# Patient Record
Sex: Male | Born: 1990 | Hispanic: No | Marital: Single | Smoking: Never smoker
Health system: Southern US, Community
[De-identification: ages and names within clinical notes are randomized; demographics above are authoritative.]

## PROBLEM LIST (undated history)

## (undated) HISTORY — PX: OTHER SURGICAL HISTORY: SHX169

## (undated) HISTORY — PX: WISDOM TOOTH EXTRACTION: SHX21

---
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
## 2011-02-17 ENCOUNTER — Ambulatory Visit: Payer: Self-pay | Admitting: Family Medicine

## 2011-02-18 ENCOUNTER — Ambulatory Visit (INDEPENDENT_AMBULATORY_CARE_PROVIDER_SITE_OTHER): Payer: Medicaid Other | Admitting: Family Medicine

## 2011-02-18 DIAGNOSIS — H6120 Impacted cerumen, unspecified ear: Secondary | ICD-10-CM

## 2011-02-18 DIAGNOSIS — I10 Essential (primary) hypertension: Secondary | ICD-10-CM

## 2011-02-18 NOTE — Patient Instructions (Signed)
Make a blood pressure check visit in 2 weeks with the nurse. Check it yourself at a pharmacy. The goal is </= 120/80. If you continue to have ear popping and feeling of stopped up.  Try OTC cetirizine (Zyrtec) 10 mg once daily. For 1 month.  Good luck with your summer plans and schooling!

## 2011-02-19 DIAGNOSIS — I10 Essential (primary) hypertension: Secondary | ICD-10-CM | POA: Insufficient documentation

## 2011-02-19 DIAGNOSIS — H6120 Impacted cerumen, unspecified ear: Secondary | ICD-10-CM | POA: Insufficient documentation

## 2011-02-19 NOTE — Assessment & Plan Note (Signed)
Ear canals both almost completely occluded by ear wax. It was removed today and pt was told that he can try Zyrtec for 1 month if he still notices the popping noise in his ears. If the Zyrtec helps we can write an Rx for it long term.

## 2011-02-19 NOTE — Progress Notes (Signed)
Ears popping: Pt is here today because he has had some popping in his ears for the last few days. He notices it more in the shower. He says they do not hurt and he is not really having any allergy symptoms. No recent URI. He does not use q-tips in his ears.   ROS: neg except as noted in HPI.   PE:  Gen: sitting comfortably on the table.  HEENT: Twining/AT, eyes are normal, ears have bilateral cerumen impactions, (no bulging of TM's after ears cleaned out). No nasal congestion, no erythema in posterior pharynx.  CV: rrr, no murmur Pulm: CTAB, no wheezing.

## 2011-02-19 NOTE — Assessment & Plan Note (Signed)
Pt has some elevated BP today. He denies problems with this in the past. Asked the pt to return in 2 weeks for a BP check and to check on his own at a pharmacy or [facility].

## 2011-10-01 ENCOUNTER — Ambulatory Visit: Payer: Medicaid Other | Admitting: Family Medicine

## 2011-10-07 ENCOUNTER — Encounter: Payer: Self-pay | Admitting: Family Medicine

## 2011-10-07 ENCOUNTER — Ambulatory Visit (INDEPENDENT_AMBULATORY_CARE_PROVIDER_SITE_OTHER): Payer: Medicare HMO | Admitting: Family Medicine

## 2011-10-07 VITALS — BP 118/75 | HR 56 | Temp 98.3°F | Ht 69.0 in | Wt 165.0 lb

## 2011-10-07 DIAGNOSIS — Z0000 Encounter for general adult medical examination without abnormal findings: Secondary | ICD-10-CM

## 2011-10-07 NOTE — Assessment & Plan Note (Signed)
Normal growth and vitals with no medical or social risk factors identified.  Advised on Gardasil and flu vaccination.  Ok to play basketball without restrictions.

## 2011-10-07 NOTE — Patient Instructions (Signed)
Today your growth, weight and vitals are healthy.  You are ok to play sports without any restrictions  I would recommend and annual flu shot and completing the Gardasil series of vaccinations.

## 2011-10-07 NOTE — Progress Notes (Signed)
  Subjective:    Patient ID: Oscar Chen, male    DOB: March 29, 1991, 21 y.o.   MRN: [id number]  HPIHere for annual preventive exam.  H (Home):  Here home on school break E (Education):  [employer]: film studies A (Activities):  Sports/hobbies; basketball, piano A (Auto/Safety):  Seatbelts D (Diet):  Balanced diet, healthy weight D (Drugs):  No tobacco, EtOH, or drug use. S (Suicide risk):  Denies sexual activity.  No SI/HI.  I have reviewed patient's  PMH, FH, and Social history and Medications as related to this visit.  Review of Systemssee HPI     Objective:   Physical Exam GEN: Alert & Oriented, No acute distress Neck: supple CV:  Regular Rate & Rhythm, no murmur Respiratory:  Normal work of breathing, CTAB Abd:  + BS, soft, no tenderness to palpation Ext: no pre-tibial edema MSK: full rom and strength of shoulders, back, knees without pain. GEN: deferred          Assessment & Plan:

## 2012-05-05 ENCOUNTER — Encounter: Payer: Self-pay | Admitting: Family Medicine

## 2012-05-05 ENCOUNTER — Ambulatory Visit (INDEPENDENT_AMBULATORY_CARE_PROVIDER_SITE_OTHER): Payer: Medicare HMO | Admitting: Family Medicine

## 2012-05-05 VITALS — BP 125/73 | HR 86 | Temp 99.1°F | Wt 171.1 lb

## 2012-05-05 DIAGNOSIS — J069 Acute upper respiratory infection, unspecified: Secondary | ICD-10-CM

## 2012-05-05 MED ORDER — DM-GUAIFENESIN ER 60-1200 MG PO TB12: 1.0000 | ORAL_TABLET | Freq: Two times a day (BID) | ORAL | Status: AC

## 2012-05-05 MED ORDER — FLUTICASONE PROPIONATE 50 MCG/ACT NA SUSP: 2.0000 | Freq: Every day | NASAL | Status: DC

## 2012-05-05 NOTE — Assessment & Plan Note (Signed)
Discussed diagnosis and treatment of URI. Suggested symptomatic remedies per AVS. Mucinex DM and Flonase to pharmacy. Red flags reviewed per AVS. Work note given. Follow up as needed.

## 2012-05-05 NOTE — Progress Notes (Signed)
  Subjective:     Oscar Chen is a 21 y.o. male who presents for evaluation of symptoms of a URI. Symptoms include achiness, nasal congestion, post nasal drip, sneezing, sore throat and ear pain bilaterally. Onset of symptoms was 1 day ago, and has been gradually worsening since that time.  Patient woke up with LT ear pain yesterday, now both ears are painful.  He also developed sore throat and runny nose today.  Denies any productive cough at this time.  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  Treatment to date: none.  Review of Systems Pertinent items are noted in HPI.   Objective:    BP 125/73  Pulse 86  Temp 99.1 F (37.3 C) (Oral)  Wt 171 lb 1.6 oz (77.61 kg) General appearance: alert, cooperative and no distress Head: Normocephalic, without obvious abnormality, atraumatic Eyes: conjunctivae/corneas clear. PERRL, EOM's intact. Fundi benign. Ears: normal TM's and external ear canals both ears Nose: Nares normal. Septum midline. Mucosa normal. No drainage or sinus tenderness. Throat: lips, mucosa, and tongue normal; teeth and gums normal Lungs: clear to auscultation bilaterally Heart: regular rate and rhythm, S1, S2 normal, no murmur, click, rub or gallop Skin: Skin color, texture, turgor normal. No rashes or lesions   Assessment:    viral upper respiratory illness   Plan:   See Problem List.

## 2012-05-05 NOTE — Patient Instructions (Addendum)
Please pick up two prescriptions at your pharmacy. Get plenty of rest and drink a lot of fluids. You can buy Cepacol cough drops to numb your sore throat. Also you can drink Chamomile tea with honey. Return to clinic or go to ED if you develop persistent high fevers chill nausea/vomiting.  Upper Respiratory Infection, Adult An upper respiratory infection (URI) is also known as the common cold. It is often caused by a type of germ (virus). Colds are easily spread (contagious). You can pass it to others by kissing, coughing, sneezing, or drinking out of the same glass. Usually, you get better in 1 or 2 weeks.  HOME CARE   Only take medicine as told by your doctor.   Use a warm mist humidifier or breathe in steam from a hot shower.   Drink enough water and fluids to keep your pee (urine) clear or pale yellow.   Get plenty of rest.   Return to work when your temperature is back to normal or as told by your doctor. You may use a face mask and wash your hands to stop your cold from spreading.  GET HELP RIGHT AWAY IF:   After the first few days, you feel you are getting worse.   You have questions about your medicine.   You have chills, shortness of breath, or brown or red spit (mucus).   You have yellow or brown snot (nasal discharge) or pain in the face, especially when you bend forward.   You have a fever, puffy (swollen) neck, pain when you swallow, or white spots in the back of your throat.   You have a bad headache, ear pain, sinus pain, or chest pain.   You have a high-pitched whistling sound when you breathe in and out (wheezing).   You have a lasting cough or cough up blood.   You have sore muscles or a stiff neck.  MAKE SURE YOU:   Understand these instructions.   Will watch your condition.   Will get help right away if you are not doing well or get worse.  Document Released: 03/09/2008 Document Revised: 09/10/2011 Document Reviewed: 01/26/2011 [facility] Patient  Information 2012 [place], [place].

## 2012-05-13 ENCOUNTER — Telehealth: Payer: Self-pay | Admitting: Family Medicine

## 2012-05-13 NOTE — Telephone Encounter (Signed)
Patient is calling for Dr. Tye Savoy about the ear ache that turned into a sore throat.  For the last 3 days, he has had nose bleeds and he would like some advice.

## 2013-03-21 ENCOUNTER — Ambulatory Visit: Payer: Medicare HMO | Admitting: Family Medicine

## 2013-03-22 ENCOUNTER — Ambulatory Visit (INDEPENDENT_AMBULATORY_CARE_PROVIDER_SITE_OTHER): Payer: No Typology Code available for payment source | Admitting: Family Medicine

## 2013-03-22 ENCOUNTER — Encounter: Payer: Self-pay | Admitting: Family Medicine

## 2013-03-22 VITALS — BP 125/60 | HR 49 | Temp 97.8°F | Ht 69.0 in | Wt 167.9 lb

## 2013-03-22 DIAGNOSIS — R040 Epistaxis: Secondary | ICD-10-CM | POA: Insufficient documentation

## 2013-03-22 LAB — CBC
HCT: 42.1 % (ref 39.0–52.0)
MCHC: 33.5 g/dL (ref 30.0–36.0)
RDW: 13 % (ref 11.5–15.5)

## 2013-03-22 NOTE — Assessment & Plan Note (Signed)
Will obtain CBC to ensure adequate platelet count. Recommended keeping nasal mucosa moist with vaseline and/or nasal saline. Patient knowledgeable about what to do to control epistaxis when it occurs.

## 2013-03-22 NOTE — Progress Notes (Signed)
Subjective:     Patient ID: Oscar Chen, male   DOB: 06-26-1991, 22 y.o.   MRN: [id number]  HPI 22 year old male presents to the clinic today for evaluation of recurrent nosebleeds.  1) Recurrent epistaxis - Patient reports that he has had nosebleeds in the past (typically 5/year), but over the past 1-2 weeks he had had several nosebleeds with larger amounts of blood than previously. - He reports that one episode a few days ago took approximately 30 mins to resolve with pressure and there was a profuse amount of blood.  His nosebleeds typically resolve in a few minutes with pressure. - He denies any family history of easy bleeding or bruising. - He reports these nosebleeds occur spontaneously and denies any recent trauma or injury.  He does endorse a prior injury to his nose years ago.  No inciting factor known, but he recently relocated from [place] to [place]. - He has not used any OTC therapies for this. - No other complaints at this time.    Review of Systems Per HPI    Objective:   Physical Exam  Constitutional: He appears well-developed and well-nourished. No distress.  HENT:  Head: Normocephalic and atraumatic.  Nasal mucosa appears erythematous.  No lesions or polyps noted.  Eyes: No scleral icterus.  Cardiovascular: Regular rhythm and normal heart sounds.   No murmur heard. Bradycardia noted.  Pulmonary/Chest: Breath sounds normal. He has no wheezes. He has no rales.  Abdominal: Soft. He exhibits no distension and no mass. There is no tenderness.      Assessment:    See problem list    Plan:

## 2013-03-22 NOTE — Patient Instructions (Addendum)
You will receive a letter or phone call with the results of your lab tests. Please keep your nose moist via vaseline and/or daily nasal saline.  If they continue to occur frequently and briskly, please return.   Nosebleed Nosebleeds can be caused by many conditions including trauma, infections, polyps, foreign bodies, dry mucous membranes or climate, medications and air conditioning. Most nosebleeds occur in the front of the nose. It is because of this location that most nosebleeds can be controlled by pinching the nostrils gently and continuously. Do this for at least 10 to 20 minutes. The reason for this long continuous pressure is that you must hold it long enough for the blood to clot. If during that 10 to 20 minute time period, pressure is released, the process may have to be started again. The nosebleed may stop by itself, quit with pressure, need concentrated heating (cautery) or stop with pressure from packing. HOME CARE INSTRUCTIONS   If your nose was packed, try to maintain the pack inside until your caregiver removes it. If a gauze pack was used and it starts to fall out, gently replace or cut the end off. Do not cut if a balloon catheter was used to pack the nose. Otherwise, do not remove unless instructed.  Avoid blowing your nose for 12 hours after treatment. This could dislodge the pack or clot and start bleeding again.  If the bleeding starts again, sit up and bending forward, gently pinch the front half of your nose continuously for 20 minutes.  If bleeding was caused by dry mucous membranes, cover the inside of your nose every morning with a petroleum or antibiotic ointment. Use your little fingertip as an applicator. Do this as needed during dry weather. This will keep the mucous membranes moist and allow them to heal.  Maintain humidity in your home by using less air conditioning or using a humidifier.  Do not use aspirin or medications which make bleeding more likely. Your  caregiver can give you recommendations on this.  Resume normal activities as able but try to avoid straining, lifting or bending at the waist for several days.  If the nosebleeds become recurrent and the cause is unknown, your caregiver may suggest laboratory tests. SEEK IMMEDIATE MEDICAL CARE IF:   Bleeding recurs and cannot be controlled.  There is unusual bleeding from or bruising on other parts of the body.  You have a fever.  Nosebleeds continue.  There is any worsening of the condition which originally brought you in.  You become lightheaded, feel faint, become sweaty or vomit blood. MAKE SURE YOU:   Understand these instructions.  Will watch your condition.  Will get help right away if you are not doing well or get worse. Document Released: 07/01/2005 Document Revised: 12/14/2011 Document Reviewed: 08/23/2009 [facility] Patient Information 2014 [place], [place].

## 2013-03-23 ENCOUNTER — Encounter: Payer: Self-pay | Admitting: Family Medicine

## 2013-07-17 ENCOUNTER — Encounter: Payer: Self-pay | Admitting: Sports Medicine

## 2013-07-17 ENCOUNTER — Ambulatory Visit (INDEPENDENT_AMBULATORY_CARE_PROVIDER_SITE_OTHER): Payer: No Typology Code available for payment source | Admitting: Sports Medicine

## 2013-07-17 VITALS — BP 121/74 | HR 52 | Ht 68.0 in | Wt 169.0 lb

## 2013-07-17 DIAGNOSIS — Z0000 Encounter for general adult medical examination without abnormal findings: Secondary | ICD-10-CM

## 2013-07-17 DIAGNOSIS — Z23 Encounter for immunization: Secondary | ICD-10-CM

## 2013-07-17 LAB — COMPREHENSIVE METABOLIC PANEL
ALT: 14 U/L (ref 0–53)
Albumin: 4.2 g/dL (ref 3.5–5.2)
CO2: 28 mEq/L (ref 19–32)
Calcium: 9.7 mg/dL (ref 8.4–10.5)
Chloride: 103 mEq/L (ref 96–112)
Creat: 0.86 mg/dL (ref 0.50–1.35)
Potassium: 3.8 mEq/L (ref 3.5–5.3)

## 2013-07-17 LAB — LIPID PANEL
Cholesterol: 132 mg/dL (ref 0–200)
LDL Cholesterol: 64 mg/dL (ref 0–99)
Triglycerides: 52 mg/dL (ref <150)

## 2013-07-17 NOTE — Patient Instructions (Addendum)
   Flu shot today  We are checking labs, I'll send you your results on MyCHART  Your last tetanus shot was on 03/11/2009, you are due in 2020 unless you have a significant cut/step on a nail  Nutrition and activity per handout    If you need anything prior to your next visit please call the clinic. Please Bring all medications or accurate medication list with you to each appointment; an accurate medication list is essential in providing you the best care possible.     Here are some basic Activity rules to remember: Look for exercise opportunities in your day:  Never lie down when you can sit; never sit when you can stand; never stand when you can pace.  Moving your body throughout the day is just as important as the 30 or 60 minutes of exercise at the gym!   Here are some basic nutrition rules to remember:  "Eat Real Foods & Drink Real Drinks" - if you think it was made in a factory . . it is likely best to avoid it as a staple in your diet.  Limiting these types of foods to 1-2 times per week is a good idea.  Sticking with fresh fruits and vegetables as well as home cooked meals will typically provide more nutrition and less salt than prepackaged meals.     Limit the amount of sugar sweetened and artificially sweetened foods and beverages.  Sticking with water flavored with a slice of lemon, lime or orange is a great option if you want something with flavor in it.  Using flavored seltzer water to flavor plain water will also add some bite if you want something more than flavor.  Avoid soda, juices and generally any bottled beverage is a good idea.     Eat at least 3 meals and 1-2 snacks per day.  Aim for no more than 5 hours between eating.   Here are 2 of my favorite web sites that provide great nutrition and exercise advice.   www.eats[place]movemore[place].com www.choosemyplate.gov

## 2013-07-17 NOTE — Progress Notes (Signed)
[facility] Oscar Chen - 22 y.o. male MRN [id number]  Date of birth: 1991/08/22  CC, HPI, INTERVAL HISTORY & ROS  Oscar Chen is here today for CPE    He reports overall been very healthy.  He denies being sexually active at this time.  Pt denies chest pain, dyspnea at rest or exertion, PND, lower extremity edema.  No urinary complaints.  No bowel complaints.  Normal sleep  Patient is active on a regular basis.  He currently works at [employer].  He has a varied diet.  He does not smoke, he does not drink.  He does not use illicit substances.  History  Past Medical, Surgical, Social, and Family History Reviewed per EMR Medications and Allergies reviewed and all updated if necessary. Objective Findings  VITALS: HR: 52 bpm  BP: 121/74 mmHg  TEMP:   ( )  RESP:    HT: 5\' 8"  (172.7 cm)  WT: 169 lb (76.658 kg)  BMI: 25.8   BP Readings from Last 3 Encounters:  07/17/13 121/74  03/22/13 125/60  05/05/12 125/73   Wt Readings from Last 3 Encounters:  07/17/13 169 lb (76.658 kg)  03/22/13 167 lb 14.4 oz (76.159 kg)  05/05/12 171 lb 1.6 oz (77.61 kg) (72%*, Z = 0.58)   * Growth percentiles are based on CDC 2-20 Years data.     PHYSICAL EXAM: GENERAL:  young adult male. In no discomfort; no respiratory distress  PSYCH: alert and appropriate, good insight   HNEENT: H&N: AT/Vega, trachea midline  Eyes: no scleral icterus, no conjunctival exudate  Ears:  tympanic membranes are pearly gray   Nose:  normal nasal mucosa   Oropharynx: MMM, no tonsillar erythema   Dentention:     CARDIO: RRR, S1/S2 heard, no murmur  LUNGS: CTA B, no wheezes, no crackles  ABDOMEN: +BS, soft, non-tender, no rigidity, no guarding, no masses/hepatosplenomegaly  EXTREM:  Warm, well perfused.  Moves all 4 extremities spontaneously; no lateralization.  No noted foot lesions.  Distal pulses 2+/4.  No pretibial edema.  GU:   SKIN:     Assessment & Plan   Problems addressed today:  General Plan & Pt Instructions:  1. Annual physical exam       Flu shot today  We are checking labs, I'll send you your results on MyCHART  Your last tetanus shot was on 03/11/2009, you are due in 2020 unless you have a significant cut/step on a nail  Nutrition and activity per handout      For further discussion of A/P and for follow up issues see problem based charting if applicable.

## 2013-07-17 NOTE — Assessment & Plan Note (Signed)
Flu shot today. Up-to-date on tetanus. Screening lipids and metabolic panel. Discuss Guardasil, patient deferred Spent time discussing healthy lifestyle

## 2013-08-03 ENCOUNTER — Encounter: Payer: Self-pay | Admitting: Sports Medicine

## 2013-08-03 ENCOUNTER — Telehealth: Payer: Self-pay | Admitting: Sports Medicine

## 2013-08-03 NOTE — Telephone Encounter (Signed)
Patient called and is wanting to know the results of his labs from a couple weeks ago.

## 2013-08-04 ENCOUNTER — Telehealth: Payer: Self-pay | Admitting: Sports Medicine

## 2013-08-04 NOTE — Telephone Encounter (Signed)
LVM for patient to call back. ?

## 2013-08-04 NOTE — Telephone Encounter (Signed)
All were normal. Letter has been sent. Please call and inform

## 2013-08-04 NOTE — Telephone Encounter (Signed)
LM informing labs are normal

## 2013-08-04 NOTE — Telephone Encounter (Signed)
Patient left message on medical records line again wanting to speak to someone concerning his recent labs.

## 2013-08-10 ENCOUNTER — Telehealth: Payer: Self-pay | Admitting: Sports Medicine

## 2013-08-10 NOTE — Telephone Encounter (Signed)
Pt called and would like Dr. Berline Chough to call him to discuss his recent lab work. He received the letter with the results but doesn't understand them. JW

## 2013-08-10 NOTE — Telephone Encounter (Signed)
Please advise. Oscar Chen  

## 2013-08-11 NOTE — Telephone Encounter (Signed)
LVM regarding that all of his labs are in an appropriate range (documented as inappropriate in the letter).   If he has any further questions he is to call and let us know.

## 2014-05-07 ENCOUNTER — Ambulatory Visit (INDEPENDENT_AMBULATORY_CARE_PROVIDER_SITE_OTHER): Payer: No Typology Code available for payment source | Admitting: Family Medicine

## 2014-05-07 ENCOUNTER — Encounter: Payer: Self-pay | Admitting: Family Medicine

## 2014-05-07 VITALS — BP 143/70 | HR 51 | Temp 98.3°F | Ht 68.0 in | Wt 176.7 lb

## 2014-05-07 DIAGNOSIS — R42 Dizziness and giddiness: Secondary | ICD-10-CM

## 2014-05-07 NOTE — Patient Instructions (Signed)
Dear Oscar Chen, Thank you for coming in to clinic today. It was good to meet you!  Today we discussed your Dizziness episode. 1. Overall, it sounds like this was an isolated incident with several inciting factors. This is most likely a stress response from your body, and would be considered a "Vaso-vagal response" 2. I do not recommend any medications or testing at this time. 3. Most important thing is reassurance, and future awareness. If it happens again pay attention to triggers and return to see your regular doctor to discuss further. 4. For the Ear Wax, recommend over the counter Debrox drops to loosen up the wax, avoid using Q-tips.  Please schedule a follow-up appointment with Dr. Claiborne Billings in the future as needed.  If you have any other questions or concerns, please feel free to call the clinic to contact me. You may also schedule an earlier appointment if necessary.  However, if your symptoms get significantly worse, please go to the Emergency Department to seek immediate medical attention.  Saralyn Pilar, DO [facility]   Vasovagal Syncope, Adult Syncope, commonly known as fainting, is a temporary loss of consciousness. It occurs when the blood flow to the brain is reduced. Vasovagal syncope (also called neurocardiogenic syncope) is a fainting spell in which the blood flow to the brain is reduced because of a sudden drop in heart rate and blood pressure. Vasovagal syncope occurs when the brain and the cardiovascular system (blood vessels) do not adequately communicate and respond to each other. This is the most common cause of fainting. It often occurs in response to fear or some other type of emotional or physical stress. The body has a reaction in which the heart starts beating too slowly or the blood vessels expand, reducing blood pressure. This type of fainting spell is generally considered harmless. However, injuries can occur if a person takes a sudden fall  during a fainting spell.  CAUSES  Vasovagal syncope occurs when a person's blood pressure and heart rate decrease suddenly, usually in response to a trigger. Many things and situations can trigger an episode. Some of these include:   Pain.   Fear.   The sight of blood or medical procedures, such as blood being drawn from a vein.   Common activities, such as coughing, swallowing, stretching, or going to the bathroom.   Emotional stress.   Prolonged standing, especially in a warm environment.   Lack of sleep or rest.   Prolonged lack of food.   Prolonged lack of fluids.   Recent illness.  The use of certain drugs that affect blood pressure, such as cocaine, alcohol, marijuana, inhalants, and opiates.  SYMPTOMS  Before the fainting episode, you may:   Feel dizzy or light headed.   Become pale.  Sense that you are going to faint.   Feel like the room is spinning.   Have tunnel vision, only seeing directly in front of you.   Feel sick to your stomach (nauseous).   See spots or slowly lose vision.   Hear ringing in your ears.   Have a headache.   Feel warm and sweaty.   Feel a sensation of pins and needles. During the fainting spell, you will generally be unconscious for no longer than a couple minutes before waking up and returning to normal. If you get up too quickly before your body can recover, you may faint again. Some twitching or jerky movements may occur during the fainting spell.  DIAGNOSIS  Your  caregiver will ask about your symptoms, take a medical history, and perform a physical exam. Various tests may be done to rule out other causes of fainting. These may include blood tests and tests to check the heart, such as electrocardiography, echocardiography, and possibly an electrophysiology study. When other causes have been ruled out, a test may be done to check the body's response to changes in position (tilt table test). TREATMENT  Most  cases of vasovagal syncope do not require treatment. Your caregiver may recommend ways to avoid fainting triggers and may provide home strategies for preventing fainting. If you must be exposed to a possible trigger, you can drink additional fluids to help reduce your chances of having an episode of vasovagal syncope. If you have warning signs of an oncoming episode, you can respond by positioning yourself favorably (lying down). If your fainting spells continue, you may be given medicines to prevent fainting. Some medicines may help make you more resistant to repeated episodes of vasovagal syncope. Special exercises or compression stockings may be recommended. In rare cases, the surgical placement of a pacemaker is considered. HOME CARE INSTRUCTIONS   Learn to identify the warning signs of vasovagal syncope.   Sit or lie down at the first warning sign of a fainting spell. If sitting, put your head down between your legs. If you lie down, swing your legs up in the air to increase blood flow to the brain.   Avoid hot tubs and saunas.  Avoid prolonged standing.  Drink enough fluids to keep your urine clear or pale yellow. Avoid caffeine.  Increase salt in your diet as directed by your caregiver.   If you have to stand for a long time, perform movements such as:   Crossing your legs.   Flexing and stretching your leg muscles.   Squatting.   Moving your legs.   Bending over.   Only take over-the-counter or prescription medicines as directed by your caregiver. Do not suddenly stop any medicines without asking your caregiver first. SEEK MEDICAL CARE IF:   Your fainting spells continue or happen more frequently in spite of treatment.   You lose consciousness for more than a couple minutes.  You have fainting spells during or after exercising or after being startled.   You have new symptoms that occur with the fainting spells, such as:   Shortness of breath.  Chest pain.    Irregular heartbeat.   You have episodes of twitching or jerky movements that last longer than a few seconds.  You have episodes of twitching or jerky movements without obvious fainting. SEEK IMMEDIATE MEDICAL CARE IF:   You have injuries or bleeding after a fainting spell.   You have episodes of twitching or jerky movements that last longer than 5 minutes.   You have more than one spell of twitching or jerky movements before returning to consciousness after fainting. MAKE SURE YOU:   Understand these instructions.  Will watch your condition.  Will get help right away if you are not doing well or get worse. Document Released: 09/07/2012 Document Reviewed: 09/07/2012 [facility] Patient Information 2015 [employer], [place]. This information is not intended to replace advice given to you by your health care provider. Make sure you discuss any questions you have with your health care provider.

## 2014-05-07 NOTE — Progress Notes (Signed)
Subjective:     Patient ID: Oscar Chen, male   DOB: 05/01/1991, 23 y.o.   MRN: [id number]  Patient presents for a same day appointment.  HPI  Dizziness Episode: - Reported acute sudden event with hearing loss, tinnitus, and dizziness / balance difficulties, lasted for 15 minutes, mostly resolved after. - States that this episode occurred while out at [employer] on Saturday [place] (05/05/14) and earlier he had been [place]. Prior to episode, he had been standing up in the warm restaurant for about 1 hour prior to event. He was eating appetizers before, and then felt borderline nausea. During episode felt brief "out of body experience". Following episode he had an associated sharp pain in bilateral jaw, worse with attempting to swallow, lasted 15 minutes and then gradually improved - Currently with no residual or recurrent symptoms. All symptoms completely resolved s/p 1st episode - No prior similar episodes before - Denied any abdominal pain, palpitations / racing heart, chest pain, SOB, HA, weakness  I have reviewed and updated the following as appropriate: allergies and current medications  Social Hx: - Never smoker - No drugs or alcohol  Review of Systems  See above HPI     Objective:   Physical Exam  BP 143/70  Pulse 51  Temp(Src) 98.3 F (36.8 C) (Oral)  Ht 5\' 8"  (1.727 m)  Wt 176 lb 11.2 oz (80.151 kg)  BMI 26.87 kg/m2  Gen - well-appearing, NAD HEENT - NCAT, PERRL, EOMI, oropharynx clear, TM's bilateral thick ear wax, MMM Neck - supple, non-tender Heart - RRR, no murmurs heard Lungs - CTAB. Normal work of breathing. Abd - soft, NTND, no masses, +active BS Ext - non-tender, no edema, peripheral pulses intact +2 b/l Skin - warm, dry Neuro - awake, alert, oriented, grossly non-focal, intact muscle strength 5/5 b/l, intact distal sensation to light touch, gait normal, cerebellar function intact     Assessment:     See  specific A&P problem list for details.      Plan:     See specific A&P problem list for details.

## 2014-05-07 NOTE — Assessment & Plan Note (Signed)
Presumed vaso-vagal episode likely due to multifactorial stress, exertion, prolonged standing in warm environment and maybe related to limited PO intake - No prior episodes - Completely resolved symptoms. No red flag symptoms. - Normal exam  Plan: 1. Monitor for future episodes 2. No indication for lab testing or further work-up today for isolated incident 3. RTC PRN

## 2014-08-14 ENCOUNTER — Ambulatory Visit (INDEPENDENT_AMBULATORY_CARE_PROVIDER_SITE_OTHER): Payer: No Typology Code available for payment source | Admitting: Family Medicine

## 2014-08-14 ENCOUNTER — Encounter: Payer: Self-pay | Admitting: Family Medicine

## 2014-08-14 VITALS — BP 129/62 | HR 70 | Temp 98.4°F | Ht 68.0 in | Wt 178.8 lb

## 2014-08-14 DIAGNOSIS — Z111 Encounter for screening for respiratory tuberculosis: Secondary | ICD-10-CM

## 2014-08-14 DIAGNOSIS — Z0000 Encounter for general adult medical examination without abnormal findings: Secondary | ICD-10-CM

## 2014-08-14 NOTE — Assessment & Plan Note (Addendum)
Patient declines flu shot today PPD given, return in 48 hours for review and will give paperwork for health clearance at that time. PHQ of 1. Will continue to monitor, discussed depression/anxiety with patient today. Patient feels that he is recovering from feeling situational anxiety, and does not a further management at this time. 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 Vision (20-20, left -right and bilateral ) and hearing passed Follow-up as needed, or in one year

## 2014-08-14 NOTE — Patient Instructions (Signed)
Tuberculin Skin Test The PPD skin test is a method used to help with the diagnosis of a disease called tuberculosis (TB). HOW THE TEST IS DONE  The test site (usually the forearm) is cleansed. The PPD extract is then injected under the top layer of skin, causing a blister to form on the skin. The reaction will take 48 - 72 hours to develop. You must return to your health care provider within that time to have the area checked. This will determine whether you have had a significant reaction to the PPD test. A reaction is measured in millimeters of hard swelling (induration) at the site. PREPARATION FOR TEST  There is no special preparation for this test. People with a skin rash or other skin irritations on their arms may need to have the test performed at a different spot on the body. Tell your health care provider if you have ever had a positive PPD skin test. If so, you should not have a repeat PPD test. Tell your doctor if you have a medical condition or if you take certain drugs, such as steroids, that can affect your immune system. These situations may lead to inaccurate test results. NORMAL FINDINGS A negative reaction (no induration) or a level of hard swelling that falls below a certain cutoff may mean that a person has not been infected with the bacteria that cause TB. There are different cutoffs for children, people with HIV, and other risk groups. Unfortunately, this is not a perfect test, and up to 20% of people infected with tuberculosis may not have a reaction on the PPD skin test. In addition, certain conditions that affect the immune system (cancer, recent chemotherapy, late-stage AIDS) may cause a false-negative test result.  The reaction will take 48 - 72 hours to develop. You must return to your health care provider within that time to have the area checked. Follow your caregiver's instructions as to where and when to report for this to be done. Ranges for normal findings may vary  among different laboratories and hospitals. You should always check with your doctor after having lab work or other tests done to discuss the meaning of your test results and whether your values are considered within normal limits. WHAT ABNORMAL RESULTS MEAN  The results of the test depend on the size of the skin reaction and on the person being tested.  A small reaction (5 mm of hard swelling at the site) is considered to be positive in people who have HIV, who are taking steroid therapy, or who have been in close contact with a person who has active tuberculosis. Larger reactions (greater than or equal to 10 mm) are considered positive in people with diabetes or kidney failure, and in health care workers, among others. In people with no known risks for tuberculosis, a positive reaction requires 15 mm or more of hard swelling at the site. RISKS AND COMPLICATIONS There is a very small risk of severe redness and swelling of the arm in people who have had a previous positive PPD test and who have the test again. There also have been a few rare cases of this reaction in people who have not been tested before. CONSIDERATIONS  A positive skin test does not necessarily mean that a person has active tuberculosis. More tests will be done to check whether active disease is present. Many people who were born outside the [place] may have had a vaccine called "BCG," which can lead to a false-positive test   result. MEANING OF TEST  Your caregiver will go over the test results with you and discuss the importance and meaning of your results, as well as treatment options and the need for additional tests if necessary. OBTAINING THE TEST RESULTS It is your responsibility to obtain your test results. Ask the lab or department performing the test when and how you will get your results. Document Released: 07/01/2005 Document Revised: 12/14/2011 Document Reviewed: 12/29/2013 [facility] Patient Information 2015  [place], [place]. This information is not intended to replace advice given to you by your health care provider. Make sure you discuss any questions you have with your health care provider.  You are cleared for your health examination certificate for work. We will need to keep the paper until you come back to have your TB skin test read. We will give you completed paperwork at that time. Good luck on your new job.

## 2014-08-14 NOTE — Progress Notes (Addendum)
   Subjective:    Patient ID: Oscar Chen, male    DOB: 11/06/1990, 23 y.o.   MRN: [id number]  HPI [facility].  Patient is a healthy 23 year old male. States he's on no medications, no recent trauma, surgery or hospitalizations. No reportable illnesses or disease. His tetanus shot is up-to-date, received in 2010. Patient's last PPD was greater than 2 years ago. He is a nonsmoker. Does not drink alcohol. He does not use recreational drugs.Works out at least 3 times a week playing basketball, doing pushups and weightlifting. He feels safe in his living environment. He does admit to feeling down depressed or hopeless for a couple of days. He states this was related to stress and feels it is situational only. He is currently living with his mother and although he was working 2 different part-time jobs he was unable to make ends meet sufficiently. This was causing him undue stress and feelings reported above. 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 Patient has had no problems with depression or anxiety in the past, has never needed medications to control.    No past medical history on file. No Known Allergies   Review of Systems Per history of present illness Sadness and anxiety    Objective:   Physical Exam BP 129/62 mmHg  Pulse 70  Temp(Src) 98.4 F (36.9 C) (Oral)  Ht 5\' 8"  (1.727 m)  Wt 178 lb 12.8 oz (81.103 kg)  BMI 27.19 kg/m2 Gen: very pleasant, African-American male, no acute distress, nontoxic in appearance, well-developed, well-nourished. HEENT: AT. [place]. Bilateral TM unable to be visualized due to cerumen impaction.. Bilateral eyes without injections or icterus. MMM. Bilateral nares without erythema or swelling. Throat without erythema or exudates.  CV: RRR, no murmur clicks gallops or rubs Chest: CTAB, no wheeze or crackles Abd: Soft. flat. NTND. BS present. no Masses palpated.  Ext: No erythema. No edema.  +2/4 PT/DP Skin: no rashes, purpura or petechiae.  Neuro:  Normal gait. PERLA. EOMi. Alert. Cranial nerves II through XII intact. DTRs equal bilaterally MSK: Upper and lower extremity 5 out of 5 muscle strength.  Psych: normal affect, dressed, demeanor and mood. Normal speech.    Assessment & Plan:

## 2014-08-16 ENCOUNTER — Ambulatory Visit: Payer: No Typology Code available for payment source | Admitting: *Deleted

## 2014-08-16 DIAGNOSIS — Z111 Encounter for screening for respiratory tuberculosis: Secondary | ICD-10-CM

## 2014-08-16 LAB — TB SKIN TEST: TB Skin Test: POSITIVE

## 2014-08-16 NOTE — Progress Notes (Signed)
PPD Reading Note PPD read and results entered in EpicCare. Result: 12 x 13 mm induration, verified by Dr. Deirdre Priesthambliss Interpretation: positive Patient given information to follow up at health department. Allergic reaction: no

## 2014-08-17 ENCOUNTER — Ambulatory Visit
Admission: RE | Admit: 2014-08-17 | Discharge: 2014-08-17 | Disposition: A | Discharge status: Home / Self Care | Payer: No Typology Code available for payment source | Source: Ambulatory Visit | Attending: Infectious Disease | Admitting: Infectious Disease

## 2014-08-17 ENCOUNTER — Other Ambulatory Visit: Payer: Self-pay | Admitting: Infectious Disease

## 2014-08-17 DIAGNOSIS — R7611 Nonspecific reaction to tuberculin skin test without active tuberculosis: Secondary | ICD-10-CM

## 2016-02-28 IMAGING — CR DG CHEST 1V
1 series · 1 of 1 positions shown · non-contrast
Comparison: 11/23/2004

CLINICAL DATA: Positive PPD

EXAM:
CHEST - 1 VIEW

[view not recorded]
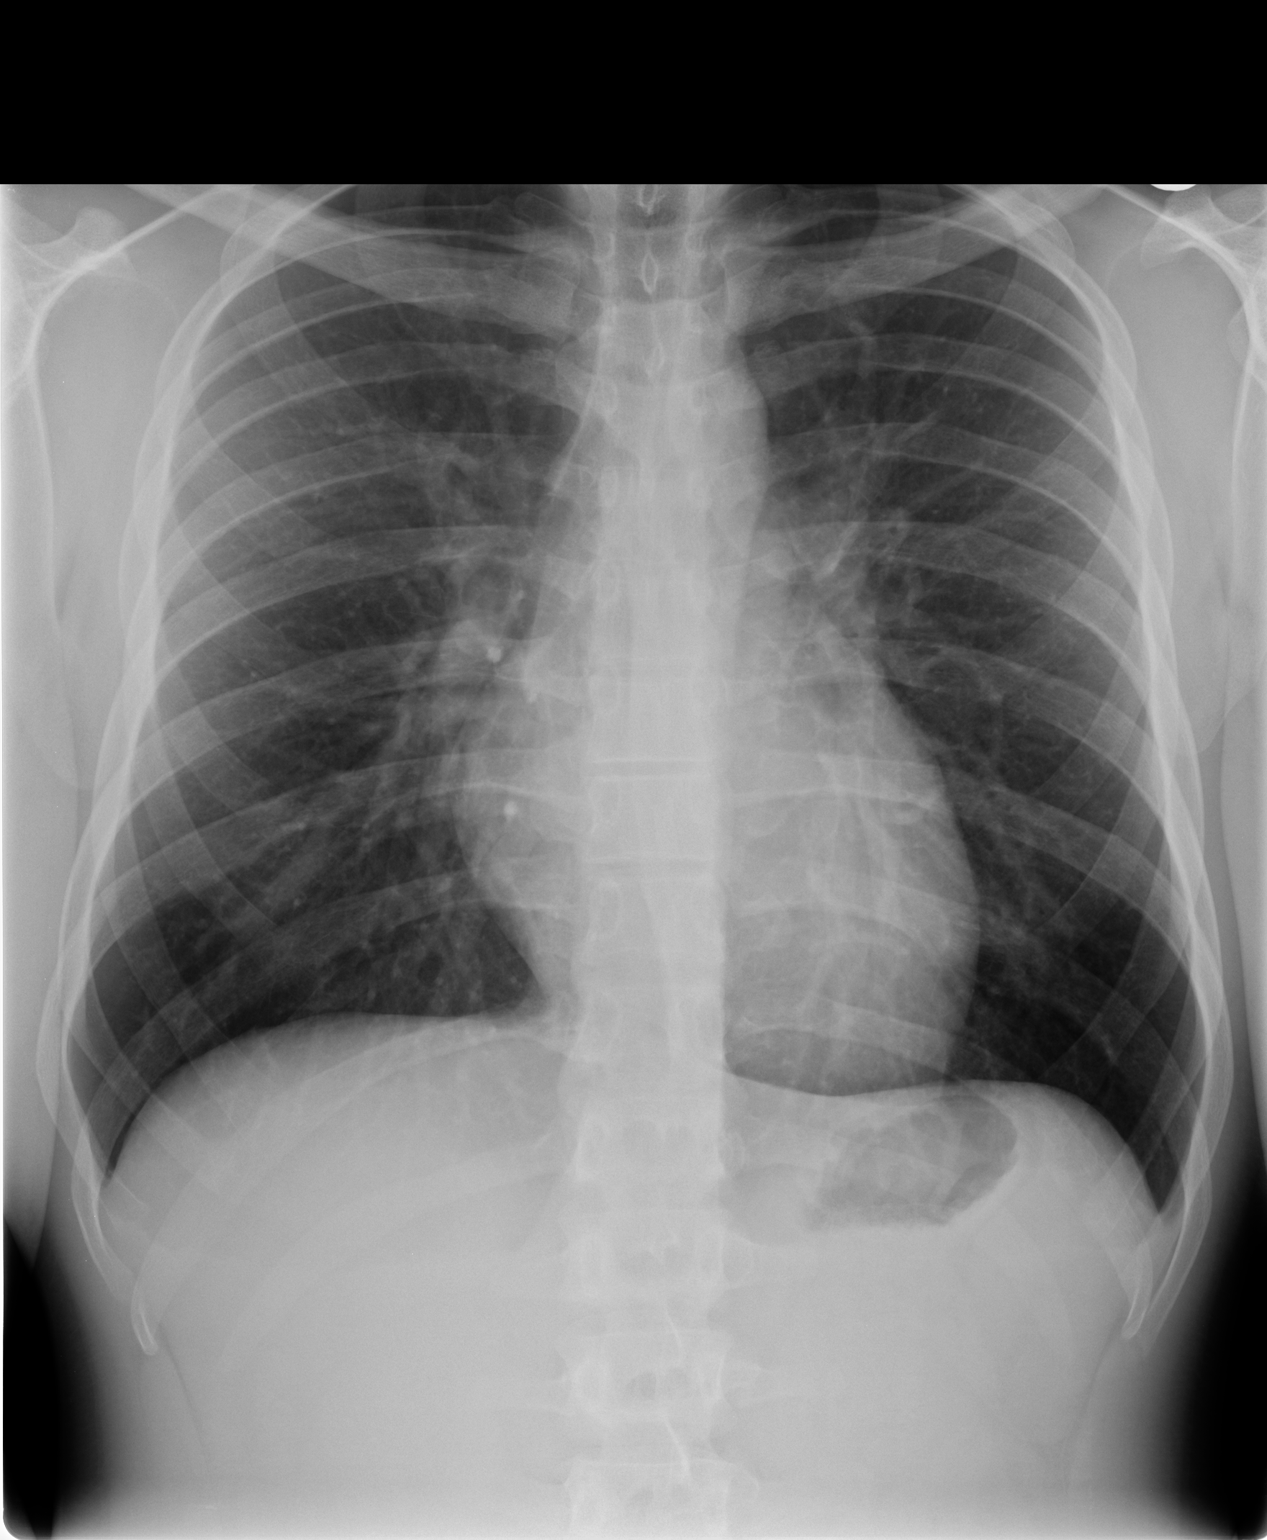

[1 of 1 positions shown; findings below may reference images not displayed]

FINDINGS: Normal heart size and mediastinal contours. No acute infiltrate or
edema. No effusion or pneumothorax. No acute osseous findings.
IMPRESSION: Negative chest.  No evidence of active pulmonary tuberculosis.
